# Patient Record
Sex: Male | Born: 1995 | Race: Black or African American | Hispanic: No | Marital: Single | Smoking: Current every day smoker
Health system: Southern US, Community
[De-identification: ages and names within clinical notes are randomized; demographics above are authoritative.]

---

## 2017-05-15 ENCOUNTER — Encounter (HOSPITAL_COMMUNITY): Payer: Self-pay

## 2017-05-15 ENCOUNTER — Other Ambulatory Visit: Payer: Self-pay

## 2017-05-15 ENCOUNTER — Emergency Department (HOSPITAL_COMMUNITY)
Admission: EM | Admit: 2017-05-15 | Discharge: 2017-05-15 | Disposition: A | Discharge status: Home / Self Care | Payer: Self-pay | Attending: Emergency Medicine | Admitting: Emergency Medicine

## 2017-05-15 ENCOUNTER — Emergency Department (HOSPITAL_COMMUNITY): Admission: EM | Admit: 2017-05-15 | Discharge: 2017-05-15 | Discharge status: Against Medical Advice | Payer: Self-pay

## 2017-05-15 DIAGNOSIS — R1030 Lower abdominal pain, unspecified: Secondary | ICD-10-CM | POA: Insufficient documentation

## 2017-05-15 DIAGNOSIS — Z5321 Procedure and treatment not carried out due to patient leaving prior to being seen by health care provider: Secondary | ICD-10-CM | POA: Insufficient documentation

## 2017-05-15 NOTE — ED Triage Notes (Signed)
Pt states that his partner was recently dx with gonorrhea. Pt concerned that he has it as well, as he has been having some groin pain. No discharge noted per pt.

## 2017-05-19 ENCOUNTER — Encounter (HOSPITAL_COMMUNITY): Payer: Self-pay | Admitting: Emergency Medicine

## 2017-05-19 ENCOUNTER — Emergency Department (HOSPITAL_COMMUNITY)
Admission: EM | Admit: 2017-05-19 | Discharge: 2017-05-19 | Disposition: A | Discharge status: Home / Self Care | Payer: Self-pay | Attending: Emergency Medicine | Admitting: Emergency Medicine

## 2017-05-19 DIAGNOSIS — Z113 Encounter for screening for infections with a predominantly sexual mode of transmission: Secondary | ICD-10-CM | POA: Insufficient documentation

## 2017-05-19 DIAGNOSIS — F17210 Nicotine dependence, cigarettes, uncomplicated: Secondary | ICD-10-CM | POA: Insufficient documentation

## 2017-05-19 DIAGNOSIS — Z7251 High risk heterosexual behavior: Secondary | ICD-10-CM

## 2017-05-19 DIAGNOSIS — R369 Urethral discharge, unspecified: Secondary | ICD-10-CM | POA: Insufficient documentation

## 2017-05-19 DIAGNOSIS — Z711 Person with feared health complaint in whom no diagnosis is made: Secondary | ICD-10-CM

## 2017-05-19 LAB — URINALYSIS, ROUTINE W REFLEX MICROSCOPIC
Bilirubin Urine: NEGATIVE
Glucose, UA: NEGATIVE mg/dL
Hgb urine dipstick: NEGATIVE
KETONES UR: NEGATIVE mg/dL
LEUKOCYTES UA: NEGATIVE
NITRITE: NEGATIVE
PH: 7 (ref 5.0–8.0)
Protein, ur: NEGATIVE mg/dL
Specific Gravity, Urine: 1.018 (ref 1.005–1.030)

## 2017-05-19 MED ORDER — CEFTRIAXONE SODIUM 250 MG IJ SOLR
250.0000 mg | Freq: Once | INTRAMUSCULAR | Status: AC
  Administered 2017-05-19: 250 mg via INTRAMUSCULAR
  Filled 2017-05-19: qty 250

## 2017-05-19 MED ORDER — AZITHROMYCIN 250 MG PO TABS
1000.0000 mg | ORAL_TABLET | Freq: Once | ORAL | Status: AC
  Administered 2017-05-19: 1000 mg via ORAL
  Filled 2017-05-19: qty 4

## 2017-05-19 MED ORDER — STERILE WATER FOR INJECTION IJ SOLN
INTRAMUSCULAR | Status: AC
  Administered 2017-05-19: 0.9 mL
  Filled 2017-05-19: qty 10

## 2017-05-19 NOTE — ED Provider Notes (Signed)
[facility]-EMERGENCY DEPT Provider Note   CSN: [id number] Arrival date & time: 05/19/17  1247     History   Chief Complaint Chief Complaint  Patient presents with  . Penile Discharge    HPI Jared Moss is a 22 y.o. otherwise healthy male, who presents to the ED requesting treatment for STDs.  Patient states that his girlfriend recently tested positive for gonorrhea and he is now wanting treatment.  He denies having any symptoms or complaints, despite the fact that this is his third visit in 4 days where he told the nurse that he was having penile discharge.  He left without being seen on the first 2 visits, which were both on 05/15/2017, so no testing was done at that time.  He is adamantly denying that he has any symptoms, including denying having any penile discharge.  He also denies any dysuria, hematuria, genital sores, testicular pain or swelling, rashes, fevers, chills, CP, SOB, abd pain, N/V/D/C, hematuria, dysuria, myalgias, arthralgias, numbness, tingling, focal weakness, or any other complaints at this time. He has been sexually active with 5 male partners in the last year, all unprotected.   The history is provided by the patient and medical records. No language interpreter was used.  Penile Discharge  Pertinent negatives include no chest pain, no abdominal pain and no shortness of breath.    History reviewed. No pertinent past medical history.  There are no active problems to display for this patient.   History reviewed. No pertinent surgical history.      Home Medications    Prior to Admission medications   Not on File    Family History No family history on file.  Social History Social History   Tobacco Use  . Smoking status: Current Every Day Smoker    Packs/day: 2.00  . Smokeless tobacco: Never Used  Substance Use Topics  . Alcohol use: Never    Frequency: Never  . Drug use: Never     Allergies   Patient has no known  allergies.   Review of Systems Review of Systems  Constitutional: Negative for chills and fever.  Respiratory: Negative for shortness of breath.   Cardiovascular: Negative for chest pain.  Gastrointestinal: Negative for abdominal pain, constipation, diarrhea, nausea and vomiting.  Genitourinary: Negative for discharge, dysuria, genital sores, hematuria, scrotal swelling and testicular pain.  Musculoskeletal: Negative for arthralgias and myalgias.  Skin: Negative for color change.  Allergic/Immunologic: Negative for immunocompromised state.  Neurological: Negative for weakness and numbness.  Psychiatric/Behavioral: Negative for confusion.   All other systems reviewed and are negative for acute change except as noted in the HPI.    Physical Exam Updated Vital Signs BP 111/63 (BP Location: Left Arm)   Pulse 62   Temp 98.9 F (37.2 C) (Oral)   Resp 16   Ht  (1.651 m)   Wt 57.6 kg (127 lb)   SpO2 95%   BMI 21.13 kg/m   Physical Exam  Constitutional: He is oriented to person, place, and time. Vital signs are normal. He appears well-developed and well-nourished.  Non-toxic appearance. No distress.  Afebrile, nontoxic, NAD  HENT:  Head: Normocephalic and atraumatic.  Mouth/Throat: Oropharynx is clear and moist and mucous membranes are normal.  Eyes: Conjunctivae and EOM are normal. Right eye exhibits no discharge. Left eye exhibits no discharge.  Neck: Normal range of motion. Neck supple.  Cardiovascular: Normal rate, regular rhythm, normal heart sounds and intact distal pulses. Exam reveals no gallop  and no friction rub.  No murmur heard. Pulmonary/Chest: Effort normal and breath sounds normal. No respiratory distress. He has no decreased breath sounds. He has no wheezes. He has no rhonchi. He has no rales.  Abdominal: Soft. Normal appearance and bowel sounds are normal. He exhibits no distension. There is no tenderness. There is no rigidity, no rebound, no guarding, no CVA  tenderness, no tenderness at McBurney's point and negative Murphy's sign.  Genitourinary:  Genitourinary Comments: Pt refused GU exam  Musculoskeletal: Normal range of motion.  Neurological: He is alert and oriented to person, place, and time. He has normal strength. No sensory deficit.  Skin: Skin is warm, dry and intact. No rash noted.  Psychiatric: He has a normal mood and affect.  Nursing note and vitals reviewed.    ED Treatments / Results  Labs (all labs ordered are listed, but only abnormal results are displayed) Labs Reviewed  URINALYSIS, ROUTINE W REFLEX MICROSCOPIC  GC/CHLAMYDIA PROBE AMP ([place]) NOT AT [facility]    EKG None  Radiology No results found.  Procedures Procedures (including critical care time)  Medications Ordered in ED Medications  azithromycin (ZITHROMAX) tablet 1,000 mg (1,000 mg Oral Given 05/19/17 1622)    And  cefTRIAXone (ROCEPHIN) injection 250 mg (250 mg Intramuscular Given 05/19/17 1623)  sterile water (preservative free) injection (0.9 mLs  Given 05/19/17 1623)     Initial Impression / Assessment and Plan / ED Course  I have reviewed the triage vital signs and the nursing notes.  Pertinent labs & imaging results that were available during my care of the patient were reviewed by me and considered in my medical decision making (see chart for details).     22 y.o. male here for STD treatment, states his girlfriend tested positive for gonorrhea and he wants treatment. Denies having complaints or symptoms. Physical exam benign, pt refused GU exam. Pt also refused having HIV/RPR testing. Will send U/A and GC/CT on urine since he refuses the GU exam. Long discussion had with pt regarding importance of safe sex, and the inappropriate use of the ER for routine STD tests/treatment. Also had long discussion with pt regarding importance for HIV/RPR testing, risks discussed and he still refused. Will empirically cover for GC/CT today.  Discussed  abstinence x10 days. F/up with health dept for future STD concerns. Safe sex encouraged, and discussed having partners tested and treated before re-engaging in intercourse after the 10 day abstinence period. Will await U/A but likely will d/c with empiric tx for GC/CT.   4:46 PM U/A unremarkable. Will proceed with the above mentioned plan. I explained the diagnosis and have given explicit precautions to return to the ER including for any other new or worsening symptoms. The patient understands and accepts the medical plan as it's been dictated and I have answered their questions. Discharge instructions concerning home care and prescriptions have been given. The patient is STABLE and is discharged to home in good condition.    Final Clinical Impressions(s) / ED Diagnoses   Final diagnoses:  Concern about STD in male without diagnosis  Unprotected sex    ED Discharge Orders    [address] [place], [place] 05/19/17 1646    Loren Racer, MD 05/20/17 [phone]

## 2017-05-19 NOTE — ED Notes (Signed)
Patient reports "I want to leave as soon as I get these medications." Patient advised to wait for discharge paperwork and to ensure there is no medication reaction. PA made aware.

## 2017-05-19 NOTE — ED Notes (Addendum)
Patient left prior to receiving d/c paperwork and updated vital signs.

## 2017-05-19 NOTE — ED Notes (Signed)
Patient not in room at this time.

## 2017-05-19 NOTE — ED Triage Notes (Addendum)
Patient c/o penile discharge and pain with urination x2 weeks. Reports partner was recently diagnosed with gonorrhea.

## 2017-05-19 NOTE — Discharge Instructions (Signed)
You have been treated for gonorrhea and chlamydia in the ER but the hospital will call you if lab is positive. NO SEXUAL INTERCOURSE FOR AT LEAST 10 DAYS AFTER TODAY'S VISIT, THIS WILL INVALIDATE YOUR TREATMENT HERE. DO NOT ENGAGE IN SEXUAL ACTIVITY UNTIL YOU FIND OUT ABOUT YOUR RESULTS AND HAVE PARTNERS TESTED AND TREATED. ALL PARTNERS MUST BE TESTED AND TREATED FOR STD'S. ALWAYS USE CONDOMS WHEN ENGAGING IN INTERCOURSE. Follow up with [facility] Department STD clinic for future STD concerns or screenings. This is the recommendation by the CDC for people with multiple sexual partners or history of STDs.  Return to the ER for emergent changes or worsening symptoms, but remember that the ER is NOT the appropriate place for routine STD testing or treatment.

## 2017-05-21 LAB — GC/CHLAMYDIA PROBE AMP (~~LOC~~) NOT AT ARMC
CHLAMYDIA, DNA PROBE: NEGATIVE
NEISSERIA GONORRHEA: POSITIVE — AB

## 2019-06-12 ENCOUNTER — Emergency Department (HOSPITAL_COMMUNITY): Payer: Self-pay

## 2019-06-12 ENCOUNTER — Emergency Department (HOSPITAL_COMMUNITY)
Admission: EM | Admit: 2019-06-12 | Discharge: 2019-06-12 | Disposition: A | Discharge status: Home / Self Care | Payer: Self-pay | Attending: Emergency Medicine | Admitting: Emergency Medicine

## 2019-06-12 ENCOUNTER — Encounter (HOSPITAL_COMMUNITY): Payer: Self-pay | Admitting: Emergency Medicine

## 2019-06-12 DIAGNOSIS — H5712 Ocular pain, left eye: Secondary | ICD-10-CM | POA: Insufficient documentation

## 2019-06-12 DIAGNOSIS — R0781 Pleurodynia: Secondary | ICD-10-CM

## 2019-06-12 DIAGNOSIS — W228XXA Striking against or struck by other objects, initial encounter: Secondary | ICD-10-CM | POA: Insufficient documentation

## 2019-06-12 DIAGNOSIS — F17210 Nicotine dependence, cigarettes, uncomplicated: Secondary | ICD-10-CM | POA: Insufficient documentation

## 2019-06-12 DIAGNOSIS — Y93B2 Activity, push-ups, pull-ups, sit-ups: Secondary | ICD-10-CM | POA: Insufficient documentation

## 2019-06-12 DIAGNOSIS — Y92481 Parking lot as the place of occurrence of the external cause: Secondary | ICD-10-CM | POA: Insufficient documentation

## 2019-06-12 MED ORDER — TETRACAINE HCL 0.5 % OP SOLN
2.0000 [drp] | Freq: Once | OPHTHALMIC | Status: AC
Start: 2019-06-12 — End: 2019-06-12
  Administered 2019-06-12: 2 [drp] via OPHTHALMIC
  Filled 2019-06-12: qty 4

## 2019-06-12 MED ORDER — FLUORESCEIN SODIUM 1 MG OP STRP
1.0000 | ORAL_STRIP | Freq: Once | OPHTHALMIC | Status: AC
  Administered 2019-06-12: 1 via OPHTHALMIC
  Filled 2019-06-12: qty 1
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
## 2019-06-12 NOTE — Discharge Instructions (Signed)
You have been seen for left rib pain and left eye pain.  Recommend that you alternate between taking ibuprofen and Tylenol every 6 hours for pain.  For example you can take ibuprofen then wait 6 hours and take Tylenol wait 6 hours and then repeat with ibuprofen.  Please follow dosing instructions found on the bottle.  You can also place ice on your ribs as this can help with inflammation and pain.  I have recommend you to an ophthalmologist please follow-up with them in 1 week for further evaluation.  I want to come back to emergency department if you develop chest pain, shortness of breath, difficulty seeing, visual changes, nausea, vomiting as he symptoms require further evaluation.
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
## 2021-09-09 IMAGING — DX DG CHEST 1V PORT
1 series · 1 of 1 positions shown · non-contrast
Comparison: None.

CLINICAL DATA: Left-sided chest pain.

EXAM:
PORTABLE CHEST 1 VIEW

[chest ap]
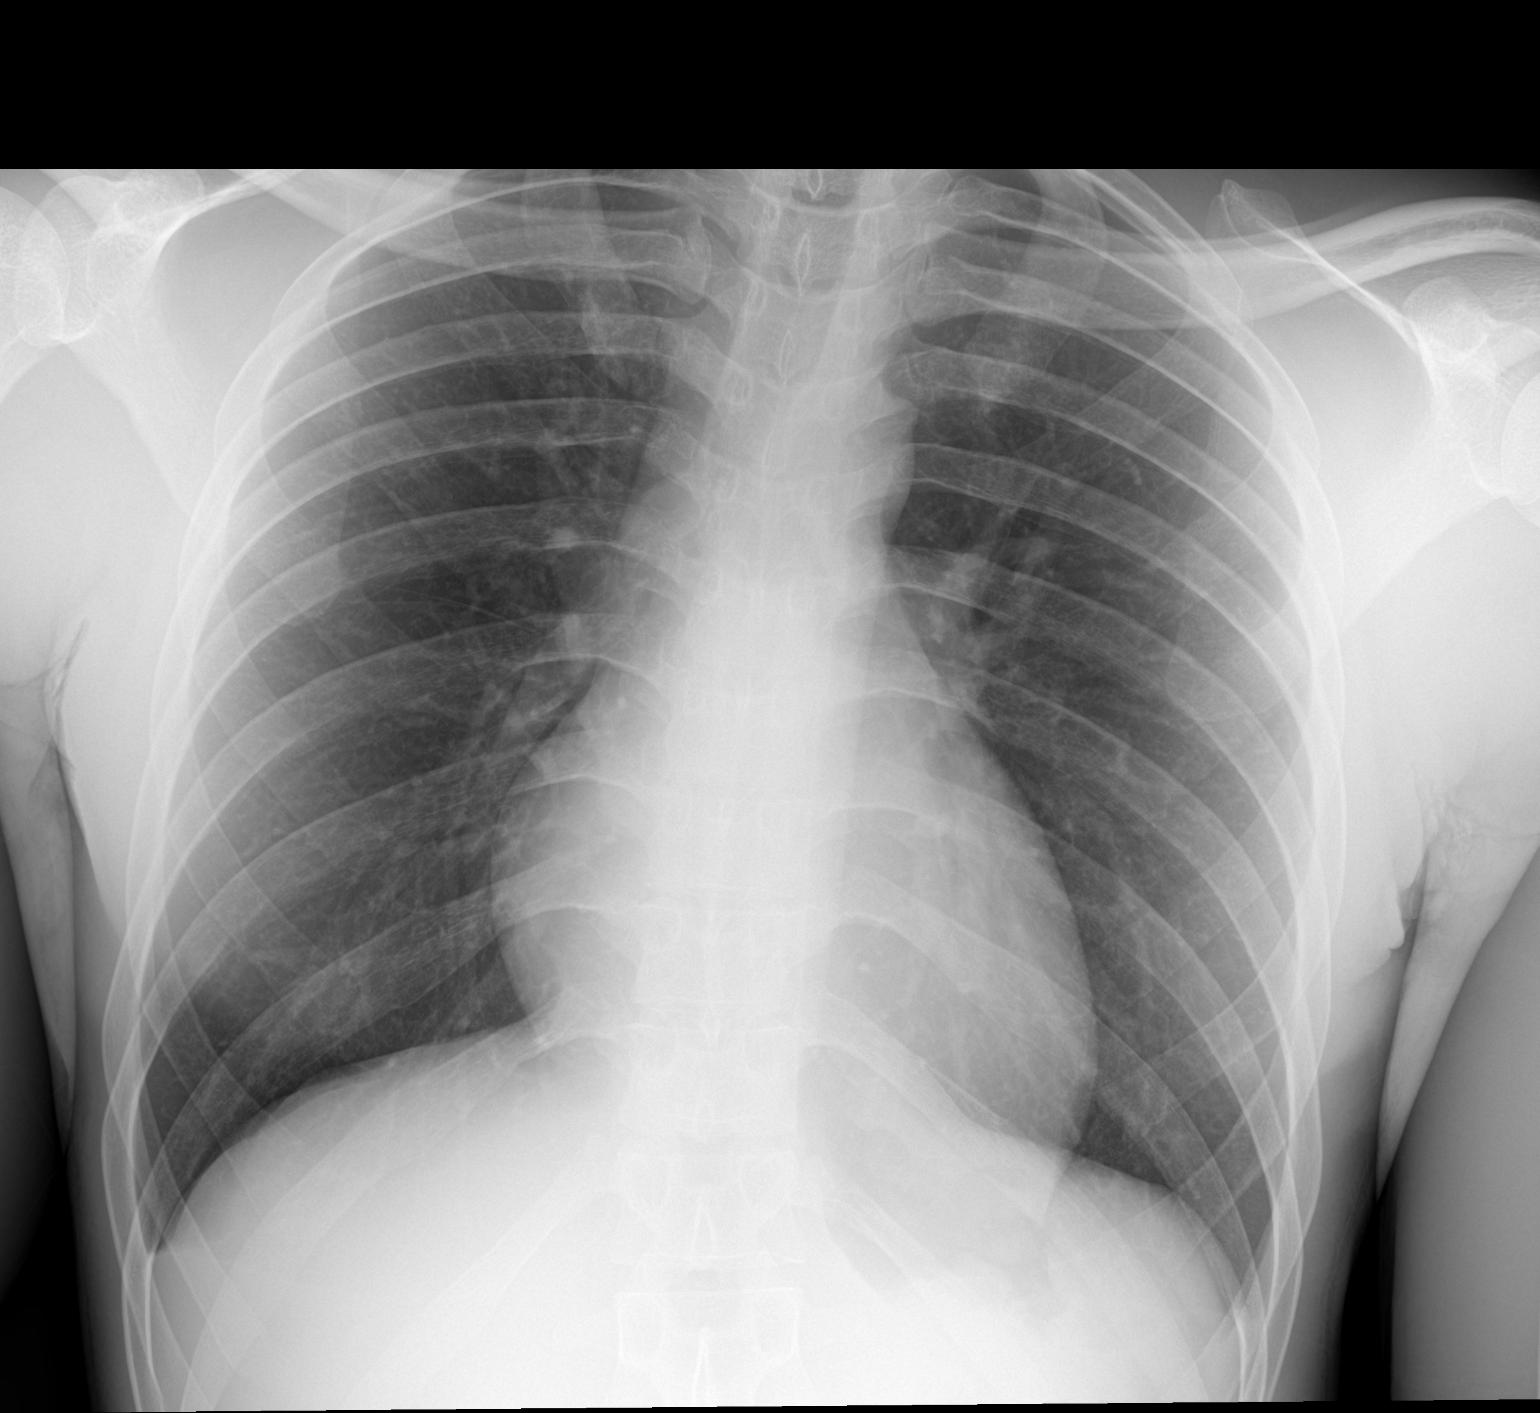

[1 of 1 positions shown; findings below may reference images not displayed]

FINDINGS: The heart size and mediastinal contours are within normal limits.
Both lungs are clear. The visualized skeletal structures are
unremarkable.
IMPRESSION: No active disease.
